# Patient Record
Sex: Female | Born: 1990 | Race: Black or African American | Hispanic: No | Marital: Single | Smoking: Never smoker
Health system: Southern US, Community
[De-identification: ages and names within clinical notes are randomized; demographics above are authoritative.]

## PROBLEM LIST (undated history)

## (undated) DIAGNOSIS — L0591 Pilonidal cyst without abscess: Secondary | ICD-10-CM

## (undated) HISTORY — DX: Pilonidal cyst without abscess: L05.91

---

## 2011-11-13 DIAGNOSIS — L0591 Pilonidal cyst without abscess: Secondary | ICD-10-CM

## 2011-11-13 HISTORY — DX: Pilonidal cyst without abscess: L05.91

## 2012-07-29 ENCOUNTER — Encounter (HOSPITAL_COMMUNITY): Payer: Self-pay | Admitting: Family Medicine

## 2012-07-29 ENCOUNTER — Emergency Department (HOSPITAL_COMMUNITY)
Admission: EM | Admit: 2012-07-29 | Discharge: 2012-07-30 | Discharge status: Against Medical Advice | Payer: 59 | Attending: Emergency Medicine | Admitting: Emergency Medicine

## 2012-07-29 DIAGNOSIS — R109 Unspecified abdominal pain: Secondary | ICD-10-CM | POA: Insufficient documentation

## 2012-07-29 LAB — COMPREHENSIVE METABOLIC PANEL
ALT: 10 U/L (ref 0–35)
Alkaline Phosphatase: 83 U/L (ref 39–117)
BUN: 11 mg/dL (ref 6–23)
CO2: 23 mEq/L (ref 19–32)
GFR calc Af Amer: >90 mL/min (ref >90)
GFR calc non Af Amer: >90 mL/min (ref >90)
Glucose, Bld: 101 mg/dL — ABNORMAL HIGH (ref 70–99)
Potassium: 3.7 mEq/L (ref 3.5–5.1)
Sodium: 134 mEq/L — ABNORMAL LOW (ref 135–145)

## 2012-07-29 LAB — CBC WITH DIFFERENTIAL/PLATELET
Eosinophils Absolute: 0.1 10*3/uL (ref 0.0–0.7)
Eosinophils Relative: 1 % (ref 0–5)
Hemoglobin: 13.2 g/dL (ref 12.0–15.0)
Lymphocytes Relative: 25 % (ref 12–46)
Lymphs Abs: 2.8 10*3/uL (ref 0.7–4.0)
MCH: 27.4 pg (ref 26.0–34.0)
MCV: 80.2 fL (ref 78.0–100.0)
Monocytes Relative: 8 % (ref 3–12)
Neutrophils Relative %: 65 % (ref 43–77)
Platelets: 230 10*3/uL (ref 150–400)
RBC: 4.81 MIL/uL (ref 3.87–5.11)
WBC: 11 10*3/uL — ABNORMAL HIGH (ref 4.0–10.5)

## 2012-07-29 LAB — URINALYSIS, MICROSCOPIC ONLY
Bilirubin Urine: NEGATIVE
Hgb urine dipstick: NEGATIVE
Ketones, ur: 15 mg/dL — AB
Nitrite: NEGATIVE
Protein, ur: NEGATIVE mg/dL
Urobilinogen, UA: 1 mg/dL (ref 0.0–1.0)

## 2012-07-29 LAB — LIPASE, BLOOD: Lipase: 20 U/L (ref 11–59)

## 2012-07-29 NOTE — ED Notes (Signed)
Pt reports lower abdominal pain starting 6 days ago described as intermittent and cramping; states getting worse and more often. Denies N/V/D or urinary problems. Denies vaginal bleeding or abnormal discharge.

## 2012-07-30 NOTE — ED Notes (Signed)
Pt returned call; informed pt of positive pregnancy test and need to return to [facility] and followup immediately per Dr Denton Lank; pt states her abd pain and cramping stopped while waiting in the ER earlier; per DR Denton Lank ok for patient to followup tomorrow for pregnancy followup or immediately for any c/o abd pain/cramping/bleeding; pt voiced understanding; informed pt of importance of follow for prenatal care; pt voiced understanding

## 2012-07-30 NOTE — ED Notes (Signed)
Second attempt made at reaching patient; message left

## 2012-07-30 NOTE — ED Notes (Signed)
Pt left without notifying triage nurse; Dr Denton Lank states must notify patient of need to return due to positive pregnancy test; attempted to call [phone] and message left; will attempt another call

## 2012-07-30 NOTE — ED Notes (Signed)
Pt told registration tech she was leaving; tech informed undersigned nurse after the pt left the hospital; attempted to call patient at number listed in chart and went straight to generic voicemail; will inform ED doctor

## 2012-08-04 ENCOUNTER — Ambulatory Visit: Payer: 59 | Admitting: Family Medicine

## 2012-08-07 ENCOUNTER — Ambulatory Visit: Payer: 59 | Admitting: Family Medicine

## 2012-09-30 ENCOUNTER — Encounter (HOSPITAL_COMMUNITY): Payer: Self-pay | Admitting: *Deleted

## 2012-09-30 ENCOUNTER — Emergency Department (HOSPITAL_COMMUNITY)
Admission: EM | Admit: 2012-09-30 | Discharge: 2012-09-30 | Disposition: A | Discharge status: Home / Self Care | Payer: 59 | Source: Home / Self Care | Attending: Emergency Medicine | Admitting: Emergency Medicine

## 2012-09-30 DIAGNOSIS — L0501 Pilonidal cyst with abscess: Secondary | ICD-10-CM

## 2012-09-30 DIAGNOSIS — M533 Sacrococcygeal disorders, not elsewhere classified: Secondary | ICD-10-CM

## 2012-09-30 MED ORDER — DOXYCYCLINE HYCLATE 100 MG PO CAPS: 100.0000 mg | ORAL_CAPSULE | Freq: Two times a day (BID) | ORAL | Status: AC

## 2012-09-30 MED ORDER — OXYCODONE-ACETAMINOPHEN 5-325 MG PO TABS: 2.0000 | ORAL_TABLET | ORAL | Status: AC | PRN

## 2012-09-30 MED ORDER — HYDROCODONE-ACETAMINOPHEN 5-325 MG PO TABS
2.0000 | ORAL_TABLET | Freq: Once | ORAL | Status: AC
  Administered 2012-09-30: 2 via ORAL

## 2012-09-30 MED ORDER — HYDROCODONE-ACETAMINOPHEN 5-325 MG PO TABS
ORAL_TABLET | ORAL | Status: AC
  Filled 2012-09-30: qty 2

## 2012-09-30 MED ORDER — OXYCODONE-ACETAMINOPHEN 5-325 MG PO TABS: 1.0000 | ORAL_TABLET | Freq: Once | ORAL | Status: DC

## 2012-09-30 NOTE — ED Provider Notes (Signed)
History     CSN: [id number]  Arrival date & time 09/30/12  1640   First MD Initiated Contact with Patient 09/30/12 1939      Chief Complaint  Patient presents with  . Abscess    (Consider location/radiation/quality/duration/timing/severity/associated sxs/prior treatment) Patient is a 21 y.o. female presenting with abscess. The history is provided by the patient.  Abscess  This is a new problem. The abscess is present on the back. The problem is severe. The abscess is characterized by painfulness, draining and swelling. It is unknown what she was exposed to. The abscess first occurred at school. Associated symptoms include a fever. Pertinent negatives include no anorexia, no decrease in physical activity, not sleeping less, no diarrhea and no vomiting. She has received no recent medical care.  Pain began in lower back 2 weeks ago, pain while sitting, noted yellow foul odor drainage today.  No history of same.  History reviewed. No pertinent past medical history.  History reviewed. No pertinent past surgical history.  Family History  Problem Relation Age of Onset  . Seizures Mother     History  Substance Use Topics  . Smoking status: Never Smoker   . Smokeless tobacco: Not on file  . Alcohol Use: No    OB History    Grav Para Term Preterm Abortions TAB SAB Ect Mult Living                  Review of Systems  Constitutional: Positive for fever.  Gastrointestinal: Negative for nausea, vomiting, diarrhea and anorexia.  Skin: Positive for wound.  All other systems reviewed and are negative.    Allergies  Review of patient's allergies indicates no known allergies.  Home Medications   Current Outpatient Rx  Name  Route  Sig  Dispense  Refill  . ASPIRIN-SALICYLAMIDE-CAFFEINE 325-95-16 MG PO TABS   Oral   Take 1 tablet by mouth daily as needed. PAIN         . DOXYCYCLINE HYCLATE 100 MG PO CAPS   Oral   Take 1 capsule (100 mg total) by mouth 2 (two) times  daily.   20 capsule   0   . OXYCODONE-ACETAMINOPHEN 5-325 MG PO TABS   Oral   Take 2 tablets by mouth every 4 (four) hours as needed for pain.   6 tablet   0     BP 114/63  Pulse 128  Temp 99.3 F (37.4 C) (Oral)  Resp 16  SpO2 100%  LMP 08/18/2012  Physical Exam  Nursing note and vitals reviewed. Constitutional: She is oriented to person, place, and time. Vital signs are normal. She appears well-developed and well-nourished. She is active and cooperative.  HENT:  Head: Normocephalic.  Eyes: Conjunctivae normal are normal. Pupils are equal, round, and reactive to light. No scleral icterus.  Neck: Trachea normal. Neck supple.  Cardiovascular: Normal rate and regular rhythm.   Pulmonary/Chest: Effort normal and breath sounds normal.  Neurological: She is alert and oriented to person, place, and time. No cranial nerve deficit or sensory deficit.  Skin: Skin is warm and dry.     Psychiatric: She has a normal mood and affect. Her speech is normal and behavior is normal. Judgment and thought content normal. Cognition and memory are normal.    ED Course  Procedures (including critical care time)   Labs Reviewed  CULTURE, ROUTINE-ABSCESS   No results found.   1. Pilonidal abscess   2. Sacral pain  MDM  Vicodin 2 tablets administered in office.  Pain improved.    Opening to cyst/abscess made slightly larger with 11 blade scapel, localized 3cc lidocaine 2% plain.  Copious amount of yellow/serosanguioneous drainage.  Pt unable to tolerate further manipulation.  Rx provided for antibiotics and pain medication.  Instructed to call [facility] tomorrow for further evaluation and treatment of abscess.          Johnsie Kindred, NP 09/30/12 2114

## 2012-09-30 NOTE — ED Notes (Signed)
Initial pain score 9/10 not 3/10.

## 2012-09-30 NOTE — ED Notes (Signed)
36  Porfirio Mylar notified that we don't have Oxycodone in the pyxis.  She said she would change the order to 2 Vicodin.

## 2012-09-30 NOTE — ED Notes (Signed)
Abscess at base of spine onset 2 weeks ago.  Started draining on arrival to room.  Never had one before.

## 2012-09-30 NOTE — ED Provider Notes (Signed)
Medical screening examination/treatment/procedure(s) were performed by non-physician practitioner and as supervising physician I was immediately available for consultation/collaboration.  Leslee Home, M.D.   Reuben Likes, MD 09/30/12 2258

## 2012-10-02 ENCOUNTER — Encounter (INDEPENDENT_AMBULATORY_CARE_PROVIDER_SITE_OTHER): Payer: Self-pay | Admitting: Surgery

## 2012-10-02 ENCOUNTER — Ambulatory Visit (INDEPENDENT_AMBULATORY_CARE_PROVIDER_SITE_OTHER): Payer: 59 | Admitting: Surgery

## 2012-10-02 VITALS — BP 102/70 | HR 68 | Temp 98.6°F | Resp 18 | Ht 63.0 in | Wt 122.4 lb

## 2012-10-02 DIAGNOSIS — L0501 Pilonidal cyst with abscess: Secondary | ICD-10-CM | POA: Insufficient documentation

## 2012-10-02 NOTE — Patient Instructions (Signed)
1.  Remove packing tomorrow AM  2.  Start Sitz baths 3 times per day.

## 2012-10-02 NOTE — Progress Notes (Signed)
[facility]  Ovidio Kin, MD,  FACS [address] [place]  [address] [place], [place]    [address] Phone:  [phone] FAX:  [phone]   Re:   ADLENA ANKER DOB:   1991/03/29 MRN:   [id number]  Urgent Office  ASSESSMENT AND PLAN: 1.  Pilonidal abscess, large.  Seen in [facility] - 09/30/2012  Placed on doxycycline.  I did an I&D today.  I gave her Vicodin, #30, no refills.  I will see her back in 2 weeks to recheck the wound.  She is to remove the packing tomorrow and start 3 x per day sitz baths.  She should continue this until she is seen back in the office.  I gave her literature on pilonidal abscesses.  HISTORY OF PRESENT ILLNESS: Chief Complaint  Patient presents with  . Cyst    Pilonidal Cyst    Avagail BANELLY ROSENBURG is a 21 y.o. (DOB: 1991/06/26)  AA female who is a patient of Provider Not In System and comes to Urgent Office for a pilonidal abscess. This has been going on about 2 weeks.  But last Friday, 09/26/2012, she noticed that it got worse.  She went to the [facility] on Tuesday, 09/30/2012.  She said they did an I&D, but I could not find much a wound.  She was then referred to our office.  She was given doxycycline and Vicodin.  She has had no prior abscess or infection like this.  Has friend, [place] with her today.  Social History: She is a Consulting civil engineer at [employer].  She is studying rehab therapy.  PHYSICAL EXAM: BP 102/70  Pulse 68  Temp 98.6 F (37 C) (Temporal)  Resp 18  Ht 5\' 3"  (1.6 m)  Wt 122 lb 6.4 oz (55.52 kg)  BMI 21.68 kg/m2  LMP 08/18/2012  Buttocks:  Large pilonidal abscess, about 4 x 8 cm.  Procedure:  I painted the buttocks area with Betadine.  I infiltrated about 12 cc of 1% xylocaine.  I did an I&D of the pilonidal abscess.  There was about 50 cc of pus under pressure.  I packed the wound with saline gauze.  DATA REVIEWED: Notes from [facility]  Ovidio Kin, MD, FACS Office:  [phone]

## 2012-10-04 LAB — CULTURE, ROUTINE-ABSCESS: Gram Stain: NONE SEEN

## 2012-10-13 ENCOUNTER — Encounter (INDEPENDENT_AMBULATORY_CARE_PROVIDER_SITE_OTHER): Payer: 59 | Admitting: Surgery

## 2013-04-09 ENCOUNTER — Emergency Department (HOSPITAL_COMMUNITY)
Admission: EM | Admit: 2013-04-09 | Discharge: 2013-04-09 | Disposition: A | Discharge status: Home / Self Care | Payer: BC Managed Care – PPO | Source: Home / Self Care | Attending: Emergency Medicine | Admitting: Emergency Medicine

## 2013-04-09 ENCOUNTER — Encounter (HOSPITAL_COMMUNITY): Payer: Self-pay | Admitting: Emergency Medicine

## 2013-04-09 DIAGNOSIS — N390 Urinary tract infection, site not specified: Secondary | ICD-10-CM

## 2013-04-09 LAB — POCT URINALYSIS DIP (DEVICE)
Glucose, UA: NEGATIVE mg/dL
Ketones, ur: NEGATIVE mg/dL
Specific Gravity, Urine: >=1.03 (ref 1.005–1.030)
Urobilinogen, UA: 1 mg/dL (ref 0.0–1.0)

## 2013-04-09 LAB — POCT PREGNANCY, URINE: Preg Test, Ur: NEGATIVE

## 2013-04-09 MED ORDER — CEPHALEXIN 500 MG PO CAPS: 500.0000 mg | ORAL_CAPSULE | Freq: Three times a day (TID) | ORAL | Status: AC

## 2013-04-09 MED ORDER — PHENAZOPYRIDINE HCL 200 MG PO TABS: 200.0000 mg | ORAL_TABLET | Freq: Three times a day (TID) | ORAL | Status: AC | PRN

## 2013-04-09 NOTE — ED Notes (Signed)
Pt c/o abd pain onset Sunday Today sx include: dysuria and she noticed blood in the tissue paper this am Denies: f/v/n/, vag d/c, vag itchiness... No hx of UTI's  She is alert and oriented w/no signs of acute distress.

## 2013-04-09 NOTE — ED Provider Notes (Signed)
Chief Complaint:   Chief Complaint  Patient presents with  . Abdominal Pain    History of Present Illness:   Brianna Valencia is a 22 year old female had a five-day history of suprapubic bladder pressure, dysuria, frequency, urgency, and pink tinged urine. She denies any fever, chills, nausea, or vomiting. No GYN complaints. No prior history of urinary tract infections. Last menstrual period was a week and a half ago. She is sexually active without use of birth control.  Review of Systems:  Other than noted above, the patient denies any of the following symptoms: General:  No fevers, chills, sweats, aches, or fatigue. GI:  No abdominal pain, back pain, nausea, vomiting, diarrhea, or constipation. GU:  No dysuria, frequency, urgency, hematuria, or incontinence. GYN:  No discharge, itching, vulvar pain or lesions, pelvic pain, or abnormal vaginal bleeding.  PMFSH:  Past medical history, family history, social history, meds, and allergies were reviewed.   Physical Exam:   Vital signs:  BP 134/73  Pulse 90  Temp(Src) 98.5 F (36.9 C) (Oral)  Resp 14  SpO2 100%  LMP 03/29/2013 Gen:  Alert, oriented, in no distress. Lungs:  Clear to auscultation, no wheezes, rales or rhonchi. Heart:  Regular rhythm, no gallop or murmer. Abdomen:  Flat and soft. There was slight suprapubic pain to palpation.  No guarding, or rebound.  No hepato-splenomegaly or mass.  Bowel sounds were normally active.  No hernia. Back:  No CVA tenderness.  Skin:  Clear, warm and dry.  Labs:    Results for orders placed during the hospital encounter of 04/09/13  POCT URINALYSIS DIP (DEVICE)      Result Value Range   Glucose, UA NEGATIVE  NEGATIVE mg/dL   Bilirubin Urine SMALL (*) NEGATIVE   Ketones, ur NEGATIVE  NEGATIVE mg/dL   Specific Gravity, Urine >=1.030  1.005 - 1.030   Hgb urine dipstick LARGE (*) NEGATIVE   pH 7.0  5.0 - 8.0   Protein, ur >=300 (*) NEGATIVE mg/dL   Urobilinogen, UA 1.0  0.0 - 1.0 mg/dL   Nitrite NEGATIVE  NEGATIVE   Leukocytes, UA TRACE (*) NEGATIVE  POCT PREGNANCY, URINE      Result Value Range   Preg Test, Ur NEGATIVE  NEGATIVE     A urine culture was obtained.  Results are pending at this time and we will call about any positive results.  Assessment: The encounter diagnosis was UTI (lower urinary tract infection).   Appears to be an uncomplicated lower urinary tract infection. No evidence for acute pyelonephritis. Will treat with cephalexin and Pyridium for symptomatic relief.  Plan:   1.  The following meds were prescribed:   Discharge Medication List as of 04/09/2013  5:57 PM    START taking these medications   Details  cephALEXin (KEFLEX) 500 MG capsule Take 1 capsule (500 mg total) by mouth 3 (three) times daily., Starting 04/09/2013, Until Discontinued, Normal    phenazopyridine (PYRIDIUM) 200 MG tablet Take 1 tablet (200 mg total) by mouth 3 (three) times daily as needed for pain., Starting 04/09/2013, Until Discontinued, Normal       2.  The patient was instructed in symptomatic care and handouts were given. 3.  The patient was told to return if becoming worse in any way, if no better in 3 or 4 days, and given some red flag symptoms such as fever or vomiting that would indicate earlier return. 4.  The patient was told to avoid intercourse for 10 days, get extra  fluids, and return for a follow up with her primary care doctor at the completion of treatment for a repeat UA and culture.     Reuben Likes, MD 04/09/13 [phone]

## 2013-04-11 LAB — URINE CULTURE: Colony Count: >=100000

## 2013-04-14 NOTE — ED Notes (Signed)
Chart review.

## 2013-04-23 NOTE — ED Notes (Signed)
Urine culture: >100,000 colonies E. Coli.  Pt. adequately treated with Keflex. 6/3 Pt. advised by Michiel Cowboy RN. Brianna Valencia 04/23/2013

## 2015-05-07 ENCOUNTER — Emergency Department (HOSPITAL_COMMUNITY)
Admission: EM | Admit: 2015-05-07 | Discharge: 2015-05-07 | Disposition: A | Discharge status: Home / Self Care | Payer: BLUE CROSS/BLUE SHIELD | Attending: Emergency Medicine | Admitting: Emergency Medicine

## 2015-05-07 ENCOUNTER — Encounter (HOSPITAL_COMMUNITY): Payer: Self-pay | Admitting: *Deleted

## 2015-05-07 ENCOUNTER — Emergency Department (HOSPITAL_COMMUNITY): Payer: BLUE CROSS/BLUE SHIELD

## 2015-05-07 DIAGNOSIS — S39012A Strain of muscle, fascia and tendon of lower back, initial encounter: Secondary | ICD-10-CM | POA: Insufficient documentation

## 2015-05-07 DIAGNOSIS — Z872 Personal history of diseases of the skin and subcutaneous tissue: Secondary | ICD-10-CM | POA: Insufficient documentation

## 2015-05-07 DIAGNOSIS — Y9389 Activity, other specified: Secondary | ICD-10-CM | POA: Insufficient documentation

## 2015-05-07 DIAGNOSIS — Y92410 Unspecified street and highway as the place of occurrence of the external cause: Secondary | ICD-10-CM | POA: Diagnosis not present

## 2015-05-07 DIAGNOSIS — S40212A Abrasion of left shoulder, initial encounter: Secondary | ICD-10-CM | POA: Insufficient documentation

## 2015-05-07 DIAGNOSIS — S3992XA Unspecified injury of lower back, initial encounter: Secondary | ICD-10-CM | POA: Diagnosis not present

## 2015-05-07 DIAGNOSIS — S4992XA Unspecified injury of left shoulder and upper arm, initial encounter: Secondary | ICD-10-CM | POA: Diagnosis present

## 2015-05-07 DIAGNOSIS — Y998 Other external cause status: Secondary | ICD-10-CM | POA: Insufficient documentation

## 2015-05-07 DIAGNOSIS — Z792 Long term (current) use of antibiotics: Secondary | ICD-10-CM | POA: Insufficient documentation

## 2015-05-07 MED ORDER — IBUPROFEN 800 MG PO TABS: 800.0000 mg | ORAL_TABLET | Freq: Three times a day (TID) | ORAL | Status: AC | PRN

## 2015-05-07 MED ORDER — TRAMADOL HCL 50 MG PO TABS: 50.0000 mg | ORAL_TABLET | Freq: Four times a day (QID) | ORAL | Status: AC | PRN
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
## 2015-05-07 NOTE — Discharge Instructions (Signed)
Return here as needed.  Your x-rays were normal.  Ice and heat on your back and anywhere else that is sore.  Expect to be sore for the next 7-10 days

## 2015-05-07 NOTE — ED Notes (Signed)
Declined W/C at D/C and was escorted to lobby by RN. 
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
